# Patient Record
Sex: Female | Born: 1955 | Race: White | Hispanic: No | Marital: Married
Health system: Southern US, Community
[De-identification: ages and names within clinical notes are randomized; demographics above are authoritative.]

---

## 2020-02-26 ENCOUNTER — Ambulatory Visit (INDEPENDENT_AMBULATORY_CARE_PROVIDER_SITE_OTHER): Payer: Medicare Other | Admitting: Orthopaedic Surgery

## 2020-02-26 ENCOUNTER — Other Ambulatory Visit: Payer: Self-pay

## 2020-02-26 ENCOUNTER — Ambulatory Visit (INDEPENDENT_AMBULATORY_CARE_PROVIDER_SITE_OTHER): Payer: Medicare Other

## 2020-02-26 ENCOUNTER — Ambulatory Visit: Payer: Self-pay

## 2020-02-26 DIAGNOSIS — M25511 Pain in right shoulder: Secondary | ICD-10-CM | POA: Diagnosis not present

## 2020-02-26 DIAGNOSIS — M25512 Pain in left shoulder: Secondary | ICD-10-CM

## 2020-02-26 DIAGNOSIS — M542 Cervicalgia: Secondary | ICD-10-CM

## 2020-02-26 MED ORDER — LIDOCAINE HCL 1 % IJ SOLN
3.0000 mL | INTRAMUSCULAR | Status: AC | PRN
  Administered 2020-02-26: 3 mL

## 2020-02-26 MED ORDER — GABAPENTIN 100 MG PO CAPS: 100.0000 mg | ORAL_CAPSULE | Freq: Three times a day (TID) | ORAL | 1 refills | Status: DC

## 2020-02-26 MED ORDER — METHYLPREDNISOLONE ACETATE 40 MG/ML IJ SUSP
40.0000 mg | INTRAMUSCULAR | Status: AC | PRN
  Administered 2020-02-26: 40 mg via INTRA_ARTICULAR

## 2020-02-26 NOTE — Progress Notes (Signed)
Office Visit Note   Patient: Sue Martinez           Date of Birth: Sep 16, 1955           MRN: [id number] Visit Date: 02/26/2020              Requested by: No referring provider defined for this encounter. PCP: Patient, No Pcp Per   Assessment & Plan: Visit Diagnoses:  1. Neck pain   2. Left shoulder pain, unspecified chronicity   3. Right shoulder pain, unspecified chronicity     Plan: Given the severity of her right shoulder pain, I did recommend a subacromial steroid injection which she agreed to and tolerated well.  Given her burning sensations and difficulty sleeping I am going to put her on Neurontin 100 mg 3 times a day.  All question concerns were answered and addressed.  I like to see her back in about 3 weeks to see how she is doing clinically.  Normally I would see her about 2 weeks but it will be the Thanksgiving holiday so we will wait for 3 weeks.  Follow-Up Instructions: Return in about 3 weeks (around 03/18/2020).   Orders:  Orders Placed This Encounter  Procedures  . Large Joint Inj  . XR Cervical Spine 2 or 3 views  . XR Shoulder Right  . XR Shoulder Left   Meds ordered this encounter  Medications  . gabapentin (NEURONTIN) 100 MG capsule    Sig: Take 1 capsule (100 mg total) by mouth 3 (three) times daily.    Dispense:  60 capsule    Refill:  1      Procedures: Large Joint Inj: R subacromial bursa on 02/26/2020 4:27 PM Indications: pain and diagnostic evaluation Details: 22 G 1.5 in needle  Arthrogram: No  Medications: 3 mL lidocaine 1 %; 40 mg methylPREDNISolone acetate 40 MG/ML Outcome: tolerated well, no immediate complications Procedure, treatment alternatives, risks and benefits explained, specific risks discussed. Consent was given by the patient. Immediately prior to procedure a time out was called to verify the correct patient, procedure, equipment, support staff and site/side marked as required. Patient was prepped and draped in the usual  sterile fashion.       Clinical Data: No additional findings.   Subjective: Chief Complaint  Patient presents with  . Left Shoulder - Pain  . Right Shoulder - Pain  The patient comes in today as a new patient for evaluation treatment of bilateral arm and shoulder pain with the right worse than the left.  She reports a burning sensation in her muscles that radiates into her right forearm and hand.  Her primary care physician has had her on prednisone which is definitely appropriate to try.  She denies any neck pain.  She does report decreased motion and strength in her right shoulder with no known injury.  She says that she had nerve studies of the right upper extremity in 1995.  She cannot tell me details from that study.  She takes ibuprofen daily for chronic spine issues.  She is not a diabetic.  HPI  Review of Systems She currently denies any headache, chest pain, shortness of breath, fever, chills, nausea, vomiting  Objective: Vital Signs: There were no vitals taken for this visit.  Physical Exam She is alert and orient x3 and in no acute distress Ortho Exam Examination of her shoulder on the right side shows a lot of guarding when I try to put her through any motion of  that shoulder.  It is clinically well located but then her pain is definitely out of portion of exam but hurts her quite a bit.  She does move her left shoulder much more easily with minimal discomfort.  She has subjective numbness in the median nerve distribution of her right upper extremity with weak grip and pinch strength.  Her neck exam is normal. Specialty Comments:  No specialty comments available.  Imaging: XR Cervical Spine 2 or 3 views  Result Date: 02/26/2020 2 views of the cervical spine show some arthritic changes on the AP view to the right side at the mid cervical spine.  The lateral view shows normal alignment.  XR Shoulder Left  Result Date: 02/26/2020 3 views of the left shoulder show no  acute findings.  XR Shoulder Right  Result Date: 02/26/2020 3 views of the right shoulder show no acute findings.    PMFS History: There are no problems to display for this patient.  No past medical history on file.  No family history on file.   Social History   Occupational History  . Not on file  Tobacco Use  . Smoking status: Not on file  Substance and Sexual Activity  . Alcohol use: Not on file  . Drug use: Not on file  . Sexual activity: Not on file

## 2020-03-18 ENCOUNTER — Ambulatory Visit (INDEPENDENT_AMBULATORY_CARE_PROVIDER_SITE_OTHER): Payer: Medicare Other | Admitting: Orthopaedic Surgery

## 2020-03-18 ENCOUNTER — Other Ambulatory Visit: Payer: Self-pay

## 2020-03-18 ENCOUNTER — Encounter: Payer: Self-pay | Admitting: Orthopaedic Surgery

## 2020-03-18 DIAGNOSIS — M25511 Pain in right shoulder: Secondary | ICD-10-CM

## 2020-03-18 DIAGNOSIS — M1712 Unilateral primary osteoarthritis, left knee: Secondary | ICD-10-CM

## 2020-03-18 DIAGNOSIS — Z96611 Presence of right artificial shoulder joint: Secondary | ICD-10-CM

## 2020-03-18 DIAGNOSIS — M25559 Pain in unspecified hip: Secondary | ICD-10-CM

## 2020-03-18 DIAGNOSIS — M1711 Unilateral primary osteoarthritis, right knee: Secondary | ICD-10-CM

## 2020-03-18 DIAGNOSIS — M25512 Pain in left shoulder: Secondary | ICD-10-CM

## 2020-03-18 MED ORDER — CELECOXIB 200 MG PO CAPS: 200.0000 mg | ORAL_CAPSULE | Freq: Two times a day (BID) | ORAL | 1 refills | Status: DC | PRN

## 2020-03-18 MED ORDER — METHYLPREDNISOLONE 4 MG PO TABS: ORAL_TABLET | ORAL | 0 refills | Status: AC

## 2020-03-18 NOTE — Progress Notes (Signed)
The patient comes in today with continued multiple joint complaints.  Her right shoulder still hurting her quite a bit and she still has a lot of guarding on my exam of the right shoulder.  She also reports right wrist pain and now said swelling in her left index finger at the MCP joint.  Her daughter is with her.  She reports that her arms feel heavy and sometimes her legs do as well.  She does not have a primary care physician.  She is 64 years old.  I did have her try Neurontin 100 mg 3 times a day and she said that did not help at all.  She still reports significant pain.  Again with examining her right shoulder she has a lot of guarding and significant pain with that shoulder.  Left shoulder moves more appropriately.  There is swelling at her index finger MCP joint on the left side.  There is no swelling or deformity of the right wrist but it does hurt when she tries open things.  Both knees and both hips hurt with range of motion.  At this point I would like to obtain a MRI of her right shoulder to rule out a rotator cuff tear.  I would also like her to try a 6-day steroid taper and Celebrex as an anti-inflammatory.  We will obtain labs in the office today which will be a rheumatoid panel with sed rate and have been met.  All question concerns were answered and addressed.  We will see her back in about 2 weeks to hopefully go over a MRI of her right shoulder and to further assess her medical status as a relates to her multiple joint complaints. 

## 2020-03-19 LAB — RHEUMATOID FACTOR: Rheumatoid fact SerPl-aCnc: <14 IU/mL (ref <14)

## 2020-03-19 LAB — BASIC METABOLIC PANEL
BUN: 21 mg/dL (ref 7–25)
CO2: 21 mmol/L (ref 20–32)
Calcium: 9.8 mg/dL (ref 8.6–10.4)
Chloride: 104 mmol/L (ref 98–110)
Creat: 0.89 mg/dL (ref 0.50–0.99)
Glucose, Bld: 75 mg/dL (ref 65–99)
Potassium: 4.6 mmol/L (ref 3.5–5.3)
Sodium: 139 mmol/L (ref 135–146)

## 2020-03-19 LAB — SEDIMENTATION RATE: Sed Rate: 94 mm/h — ABNORMAL HIGH (ref 0–30)

## 2020-03-20 ENCOUNTER — Other Ambulatory Visit: Payer: Self-pay

## 2020-03-20 ENCOUNTER — Ambulatory Visit (HOSPITAL_BASED_OUTPATIENT_CLINIC_OR_DEPARTMENT_OTHER)
Admission: RE | Admit: 2020-03-20 | Discharge: 2020-03-20 | Disposition: A | Discharge status: Home / Self Care | Payer: Medicare Other | Source: Ambulatory Visit | Attending: Orthopaedic Surgery | Admitting: Orthopaedic Surgery

## 2020-03-20 DIAGNOSIS — Z96611 Presence of right artificial shoulder joint: Secondary | ICD-10-CM | POA: Insufficient documentation

## 2020-04-01 ENCOUNTER — Ambulatory Visit (INDEPENDENT_AMBULATORY_CARE_PROVIDER_SITE_OTHER): Payer: Medicare Other | Admitting: Orthopaedic Surgery

## 2020-04-01 ENCOUNTER — Encounter: Payer: Self-pay | Admitting: Orthopaedic Surgery

## 2020-04-01 DIAGNOSIS — M25531 Pain in right wrist: Secondary | ICD-10-CM

## 2020-04-01 DIAGNOSIS — M25511 Pain in right shoulder: Secondary | ICD-10-CM | POA: Diagnosis not present

## 2020-04-01 DIAGNOSIS — M5441 Lumbago with sciatica, right side: Secondary | ICD-10-CM

## 2020-04-01 DIAGNOSIS — M25512 Pain in left shoulder: Secondary | ICD-10-CM | POA: Diagnosis not present

## 2020-04-01 DIAGNOSIS — G8929 Other chronic pain: Secondary | ICD-10-CM

## 2020-04-01 NOTE — Progress Notes (Signed)
The patient comes in today in follow-up for multiple joint complaints.  She had severe right shoulder pain and moderate left shoulder pain.  She has had problems with low back pain and right-sided sciatica.  She also has been dealing with knee pain and right wrist pain over the first dorsal compartment.  I put her on Celebrex and she said the Celebrex was wonderful but also tried a 6-day steroid taper and she said that set her back and states the Celebrex is not working as well.  However, she looks much better overall and is ambulatory in the room more active than what I saw a few weeks ago.  We did obtain an MRI of the right shoulder and it showed moderate tendinosis of the rotator cuff with some bursal surface irregularity but no gross tear.  Examination of both shoulder shows improved abduction but it is painful.  She has negative straight leg raise on the right side.  Her right hip moves normally her right knee is moving well today.  She does have pain to palpation over the first dorsal compartment of her right wrist.  She is moving her shoulders certainly better and overall just get out of the chair much easier.  Her rheumatoid labs showed a negative rheumatoid factor.  Her sed rate was elevated significantly showing inflammatory process going on.  She would absolutely benefit from continuing the Celebrex.  I think this will have more effect once the steroid is out of her system.  Also would like her to alternate some Tylenol arthritis in between daily.  I gave her a generic prescription for outpatient physical therapy in [facility].  She will set this up and her daughter is with her and understands that.  We can have them work on any modalities to improve her bilateral shoulder function and decrease their pain as well as working on her low back and right wrist.  All questions and concerns were answered and addressed.  We will see her back in about 6 weeks to see how she is doing overall.

## 2020-04-13 ENCOUNTER — Telehealth: Payer: Self-pay | Admitting: Orthopaedic Surgery

## 2020-04-13 NOTE — Telephone Encounter (Signed)
02/26/20 ov note faxed to referring office [phone]

## 2020-05-13 ENCOUNTER — Encounter: Payer: Self-pay | Admitting: Orthopaedic Surgery

## 2020-05-13 ENCOUNTER — Ambulatory Visit (INDEPENDENT_AMBULATORY_CARE_PROVIDER_SITE_OTHER): Payer: Medicare Other | Admitting: Orthopaedic Surgery

## 2020-05-13 ENCOUNTER — Telehealth: Payer: Self-pay

## 2020-05-13 ENCOUNTER — Other Ambulatory Visit: Payer: Self-pay | Admitting: Orthopaedic Surgery

## 2020-05-13 DIAGNOSIS — M25512 Pain in left shoulder: Secondary | ICD-10-CM | POA: Diagnosis not present

## 2020-05-13 DIAGNOSIS — M25511 Pain in right shoulder: Secondary | ICD-10-CM

## 2020-05-13 MED ORDER — ACETAMINOPHEN-CODEINE #3 300-30 MG PO TABS
1.0000 | ORAL_TABLET | Freq: Three times a day (TID) | ORAL | 0 refills | Status: AC | PRN
Start: 2020-05-13 — End: ?

## 2020-05-13 MED ORDER — GABAPENTIN 300 MG PO CAPS: 300.0000 mg | ORAL_CAPSULE | Freq: Every day | ORAL | 1 refills | Status: AC

## 2020-05-13 NOTE — Telephone Encounter (Signed)
error 

## 2020-05-13 NOTE — Progress Notes (Signed)
The patient is still dealing with significant bilateral shoulder pain with significant stiffness in both shoulders.  We did have a MRI recently of her right shoulder showing severe tendinosis of the rotator cuff.  She is in physical therapy now.  Celebrex and Tylenol arthritis have not helped her at all.  She is 65 years old and very frustrated at the detrimental effect her decrease in shoulder motion and mobility is having on her actives daily living and her quality of life.  She is not getting comfortable sleep at night either.  Examination of both shoulder shows significant limitations with abduction and rotation in general.  I feel that she is potentially developing frozen shoulders.  I would like to send her to Dr. Prince Rome for an ultrasound assessment of both shoulders with an intra-articular injection of a steroid in each shoulder glenohumeral joint.  He can then get her back to me 2 to 3 weeks after those injections.  I want to start her on Tylenol 3 to take sparingly as well as Neurontin 300 mg at night.  All questions and concerns were answered and addressed.  She agrees with this treatment plan and will continue her therapy as well.

## 2020-05-14 ENCOUNTER — Other Ambulatory Visit: Payer: Self-pay

## 2020-05-14 ENCOUNTER — Ambulatory Visit (INDEPENDENT_AMBULATORY_CARE_PROVIDER_SITE_OTHER): Payer: Medicare Other | Admitting: Family Medicine

## 2020-05-14 ENCOUNTER — Ambulatory Visit: Payer: Self-pay

## 2020-05-14 DIAGNOSIS — M25512 Pain in left shoulder: Secondary | ICD-10-CM

## 2020-05-14 DIAGNOSIS — M25511 Pain in right shoulder: Secondary | ICD-10-CM

## 2020-05-14 NOTE — Progress Notes (Signed)
Subjective: Patient is here for ultrasound-guided intra-articular bilateral glenohumeral injection.  Chronic pain in both shoulders, very difficult to reach behind her back.  Objective: Decreased range of motion in both with pain at the extremes.  Procedure: Ultrasound guided injection is preferred based studies that show increased duration, increased effect, greater accuracy, decreased procedural pain, increased response rate, and decreased cost with ultrasound guided versus blind injection.   Verbal informed consent obtained.  Time-out conducted.  Noted no overlying erythema, induration, or other signs of local infection. Ultrasound-guided bilateral glenohumeral injection: After sterile prep with Betadine, injected 4 cc 0.25% bupivocaine without epinephrine and 6 mg betamethasone using a 22-gauge spinal needle, passing the needle from posterior approach into the glenohumeral joint.  Injectate seen filling both joint capsules.  Very good immediate relief.

## 2020-05-26 ENCOUNTER — Encounter: Payer: Self-pay | Admitting: Orthopaedic Surgery

## 2020-05-26 ENCOUNTER — Ambulatory Visit (INDEPENDENT_AMBULATORY_CARE_PROVIDER_SITE_OTHER): Payer: Medicare Other | Admitting: Orthopaedic Surgery

## 2020-05-26 DIAGNOSIS — M7542 Impingement syndrome of left shoulder: Secondary | ICD-10-CM

## 2020-05-26 DIAGNOSIS — M25512 Pain in left shoulder: Secondary | ICD-10-CM | POA: Diagnosis not present

## 2020-05-26 DIAGNOSIS — M7541 Impingement syndrome of right shoulder: Secondary | ICD-10-CM | POA: Diagnosis not present

## 2020-05-26 DIAGNOSIS — G8929 Other chronic pain: Secondary | ICD-10-CM | POA: Diagnosis not present

## 2020-05-26 DIAGNOSIS — M25511 Pain in right shoulder: Secondary | ICD-10-CM

## 2020-05-26 NOTE — Progress Notes (Signed)
The patient comes in today feeling much better with her shoulders after Dr. Prince Rome provided injections in both shoulders under ultrasound.  She is also been very compliant with physical therapy and trying to push herself through severe tendinitis of both her shoulders.  Her daughter is with her today and also agrees that her mother is doing much better overall.  She does state the injections were painful.  She is moving her shoulders much better on both sides with overhead activities and reaching behind her which is still limited but she is certainly getting there and looks better overall from my last exams with her.  She will continue to push herself with her shoulders.  I can certainly see her back in 3 months to make sure she is doing well with her severe impingement syndrome of both shoulders.

## 2020-08-23 ENCOUNTER — Encounter: Payer: Self-pay | Admitting: Orthopaedic Surgery

## 2020-08-23 ENCOUNTER — Ambulatory Visit (INDEPENDENT_AMBULATORY_CARE_PROVIDER_SITE_OTHER): Payer: Medicare Other | Admitting: Orthopaedic Surgery

## 2020-08-23 DIAGNOSIS — M25512 Pain in left shoulder: Secondary | ICD-10-CM

## 2020-08-23 DIAGNOSIS — M25511 Pain in right shoulder: Secondary | ICD-10-CM

## 2020-08-23 DIAGNOSIS — M7542 Impingement syndrome of left shoulder: Secondary | ICD-10-CM | POA: Diagnosis not present

## 2020-08-23 DIAGNOSIS — M7541 Impingement syndrome of right shoulder: Secondary | ICD-10-CM | POA: Diagnosis not present

## 2020-08-23 MED ORDER — MELOXICAM 15 MG PO TABS: 15.0000 mg | ORAL_TABLET | Freq: Every day | ORAL | 3 refills | Status: DC

## 2020-08-23 NOTE — Progress Notes (Signed)
The patient comes today stating that she is about 75% better with both of her shoulders hurting but she still gets some biceps pain bilaterally and now has bilateral wrist pain and she points to prominent ulnar styloids on both wrists a source of pain.  She said a family member had recently recommended meloxicam who does have rheumatologic issues and so the meloxicam has helped that family member quite a bit.  She does report overall though she has made some progress.  Both shoulders seem to move better overall.  There is still some pain around the biceps and some signs of impingement but overall her mobility is much better.  Both wrists have prominent ulnar styloids and pain with pronation supination but there is no blocks to rotation.  There is no instability of her wrist on exam and the ECU tendon does not snap on either side.  I will certainly send in meloxicam as an anti-inflammatory.  If this works, that would be great and we will keep her on this.  I also recommended referral to rheumatologist due to multiple musculoskeletal joint aches and pains and complaints.  If the meloxicam does not help her, she will call us for that referral.  All questions and concerns were answered and addressed.

## 2021-04-05 ENCOUNTER — Other Ambulatory Visit: Payer: Self-pay | Admitting: Physician Assistant

## 2021-04-05 ENCOUNTER — Telehealth: Payer: Self-pay | Admitting: Orthopaedic Surgery

## 2021-04-05 MED ORDER — MELOXICAM 15 MG PO TABS
15.0000 mg | ORAL_TABLET | Freq: Every day | ORAL | 3 refills | Status: DC
Start: 2021-04-05 — End: 2021-12-22

## 2021-04-05 NOTE — Telephone Encounter (Signed)
Pt called stating she was prescribed meloxicam and it's working for her, but she would like to see if she could get something a little stronger. Pt asked for a CB from Dr. Magnus Ivan to discuss further.   [phone]

## 2021-04-05 NOTE — Telephone Encounter (Signed)
Please advise 

## 2021-12-22 ENCOUNTER — Other Ambulatory Visit: Payer: Self-pay | Admitting: Physician Assistant

## 2021-12-22 ENCOUNTER — Other Ambulatory Visit: Payer: Self-pay | Admitting: Orthopaedic Surgery

## 2021-12-22 ENCOUNTER — Telehealth: Payer: Self-pay | Admitting: Orthopaedic Surgery

## 2021-12-22 MED ORDER — MELOXICAM 15 MG PO TABS: 15.0000 mg | ORAL_TABLET | Freq: Every day | ORAL | 3 refills | Status: AC | PRN

## 2021-12-22 NOTE — Telephone Encounter (Signed)
Pt called and needs refill on meloxicam

## 2022-08-22 ENCOUNTER — Other Ambulatory Visit: Payer: Self-pay | Admitting: Orthopaedic Surgery

## 2022-09-12 IMAGING — MR MR SHOULDER*R* W/O CM
7 series · 40 of 40 positions shown · non-contrast
Comparison: None.

CLINICAL DATA: Right shoulder pain. Anterior proximal shoulder pain
with burning sensation and limited range of motion.

EXAM:
MRI OF THE RIGHT SHOULDER WITHOUT CONTRAST
TECHNIQUE: Multiplanar, multisequence MR imaging of the shoulder was performed.
No intravenous contrast was administered.

[Series 4: T2 fat-sat · oblique · 4.0mm · 0.55mm/px · 6 of 18 slices shown (1 of 3)]
[im 1/18]
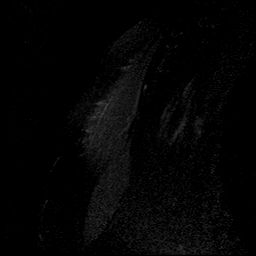
[im 4/18]
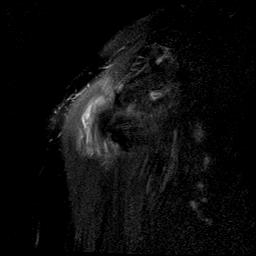
[im 7/18]
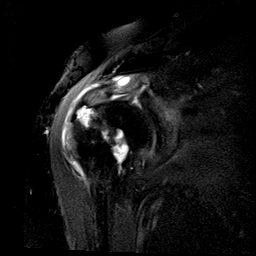
[im 11/18]
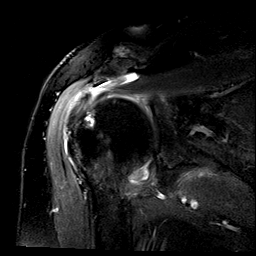
[im 14/18]
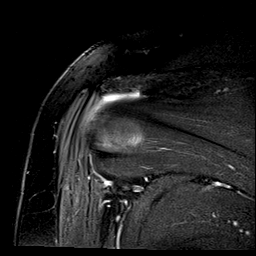
[im 18/18]
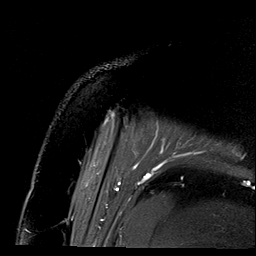

[Series 5: PD fat-sat · oblique · 4.0mm · 0.55mm/px · 6 of 18 slices shown (1 of 2)]
[im 1/18]
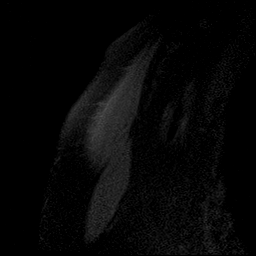
[im 4/18]
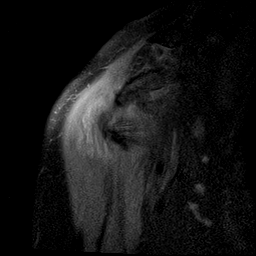
[im 7/18]
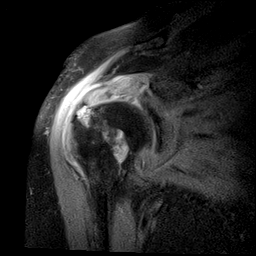
[im 11/18]
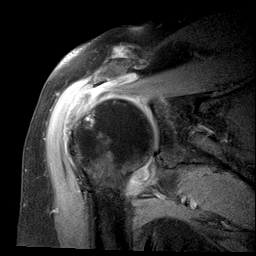
[im 14/18]
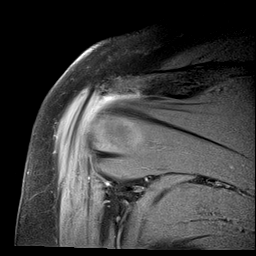
[im 18/18]
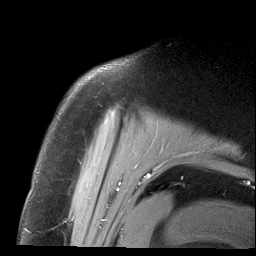

[Series 6: T2 fat-sat · oblique · 4.0mm · 0.55mm/px · 6 of 18 slices shown (2 of 3)]
[im 1/18]
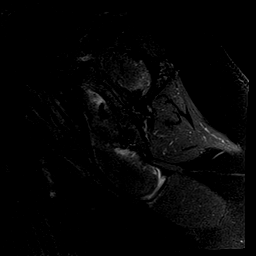
[im 4/18]
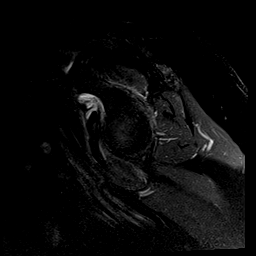
[im 7/18]
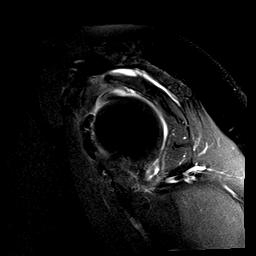
[im 11/18]
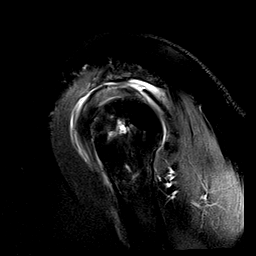
[im 14/18]
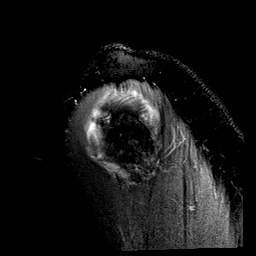
[im 18/18]
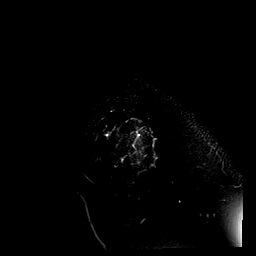

[Series 7: T1 · oblique · 4.0mm · 0.55mm/px · 6 of 18 slices shown]
[im 1/18]
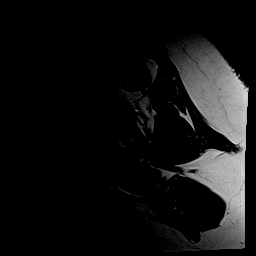
[im 4/18]
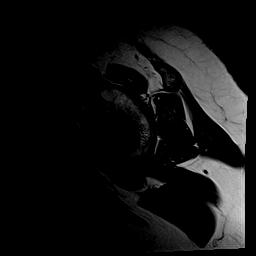
[im 7/18]
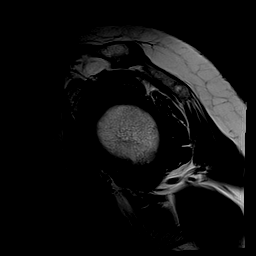
[im 11/18]
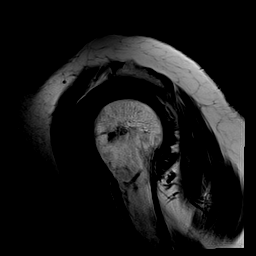
[im 14/18]
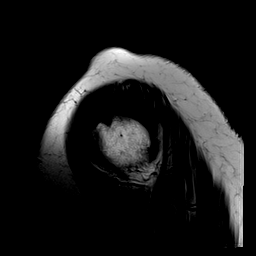
[im 18/18]
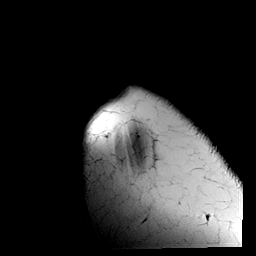

[Series 8: PD fat-sat · axial · 4.0mm · 0.55mm/px · z∈[-47,+34]mm · 7 of 20 slices shown (2 of 2)]
[im 1/20]
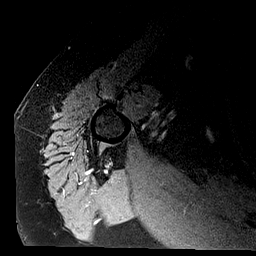
[im 4/20]
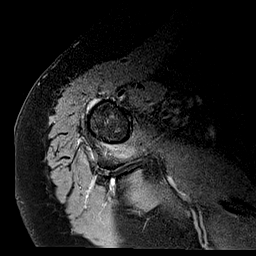
[im 7/20]
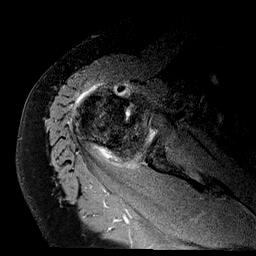
[im 10/20]
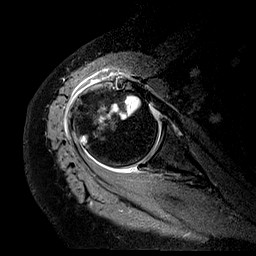
[im 13/20]
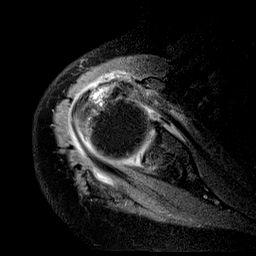
[im 16/20]
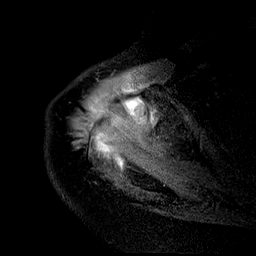
[im 20/20]
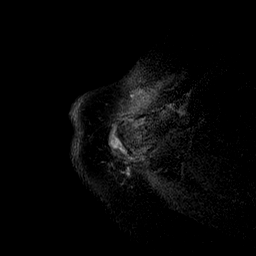

[Series 9: T2 fat-sat · axial · 4.0mm · 0.55mm/px · z∈[-47,+34]mm · 7 of 20 slices shown (3 of 3)]
[im 1/20]
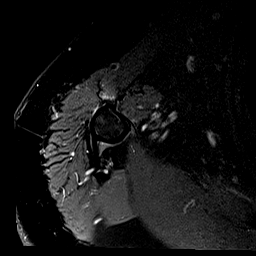
[im 4/20]
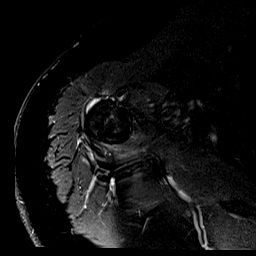
[im 7/20]
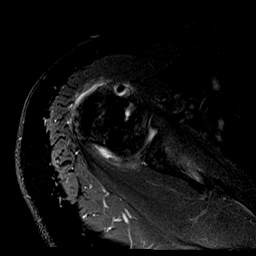
[im 10/20]
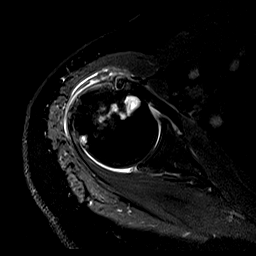
[im 13/20]
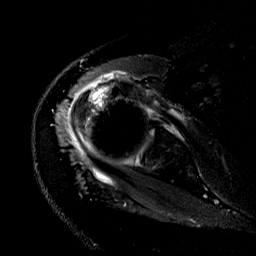
[im 16/20]
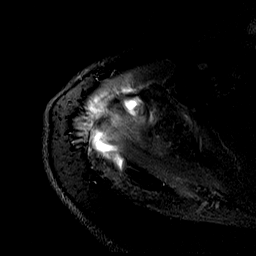
[im 20/20]
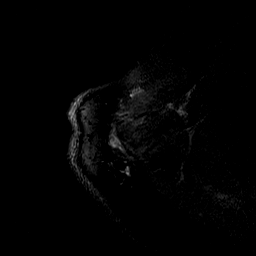

[Series 5002: hx · axial · 8.0mm · 0.86mm/px · z∈[-36,+36]mm · 2 of 7 slices shown]
[im 1/7]
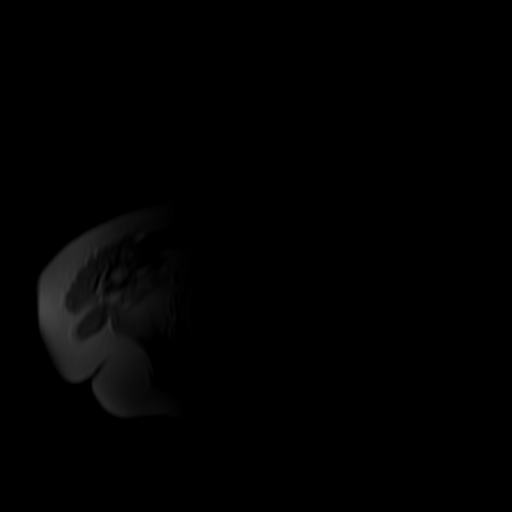
[im 7/7]
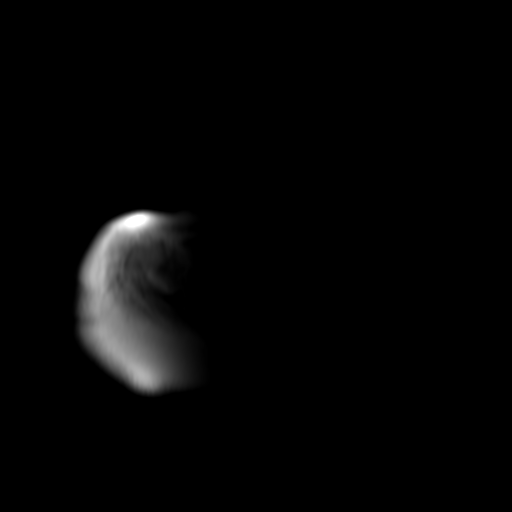

[40 of 40 positions shown; findings below may reference images not displayed]

FINDINGS: Rotator cuff: Severe tendinosis of the supraspinatus tendon.
Moderate tendinosis of the infraspinatus tendon with a small
partial-thickness bursal surface tear anteriorly. Teres minor tendon
is intact. Subscapularis tendon is intact.

Muscles: No muscle atrophy or edema. No intramuscular fluid
collection or hematoma.

Biceps Long Head: Moderate tendinosis of the intra-articular portion
of the long head of the biceps tendon.

Acromioclavicular Joint: Moderate arthropathy of the
acromioclavicular joint. Type II acromion. Small amount of
subacromial/subdeltoid bursal fluid.

Glenohumeral Joint: No joint effusion. Mild partial-thickness
cartilage loss of the glenohumeral joint.

Labrum: Small posterosuperior labral tear.

Bones: No fracture or dislocation. No aggressive osseous lesion.
Subcortical reactive marrow changes at the rotator cuff insertion.

Other: No fluid collection or hematoma.
IMPRESSION: 1. Severe tendinosis of the supraspinatus tendon.
2. Moderate tendinosis of the infraspinatus tendon with a small
partial-thickness bursal surface tear anteriorly.
3. Moderate tendinosis of the intra-articular portion of the long
head of the biceps tendon.
4. Mild subacromial/subdeltoid bursitis.
